# Patient Record
Sex: Male | Born: 1985 | Race: White | Hispanic: No | Marital: Single | Smoking: Never smoker
Health system: Southern US, Community
[De-identification: ages and names within clinical notes are randomized; demographics above are authoritative.]

## PROBLEM LIST (undated history)

## (undated) DIAGNOSIS — F1910 Other psychoactive substance abuse, uncomplicated: Secondary | ICD-10-CM

## (undated) DIAGNOSIS — T7840XA Allergy, unspecified, initial encounter: Secondary | ICD-10-CM

## (undated) DIAGNOSIS — F419 Anxiety disorder, unspecified: Secondary | ICD-10-CM

## (undated) HISTORY — DX: Anxiety disorder, unspecified: F41.9

## (undated) HISTORY — DX: Allergy, unspecified, initial encounter: T78.40XA

## (undated) HISTORY — DX: Other psychoactive substance abuse, uncomplicated: F19.10

---

## 2013-02-22 ENCOUNTER — Other Ambulatory Visit: Payer: Self-pay | Admitting: *Deleted

## 2013-02-22 ENCOUNTER — Ambulatory Visit
Admission: RE | Admit: 2013-02-22 | Discharge: 2013-02-22 | Disposition: A | Discharge status: Home / Self Care | Payer: No Typology Code available for payment source | Source: Ambulatory Visit | Attending: *Deleted | Admitting: *Deleted

## 2013-02-22 DIAGNOSIS — R7611 Nonspecific reaction to tuberculin skin test without active tuberculosis: Secondary | ICD-10-CM

## 2015-08-29 IMAGING — CR DG CHEST 2V
2 series · 2 of 2 positions shown · non-contrast
Comparison: None.

CLINICAL DATA: Positive PPD.

EXAM:
CHEST  2 VIEW

[w chest pa]
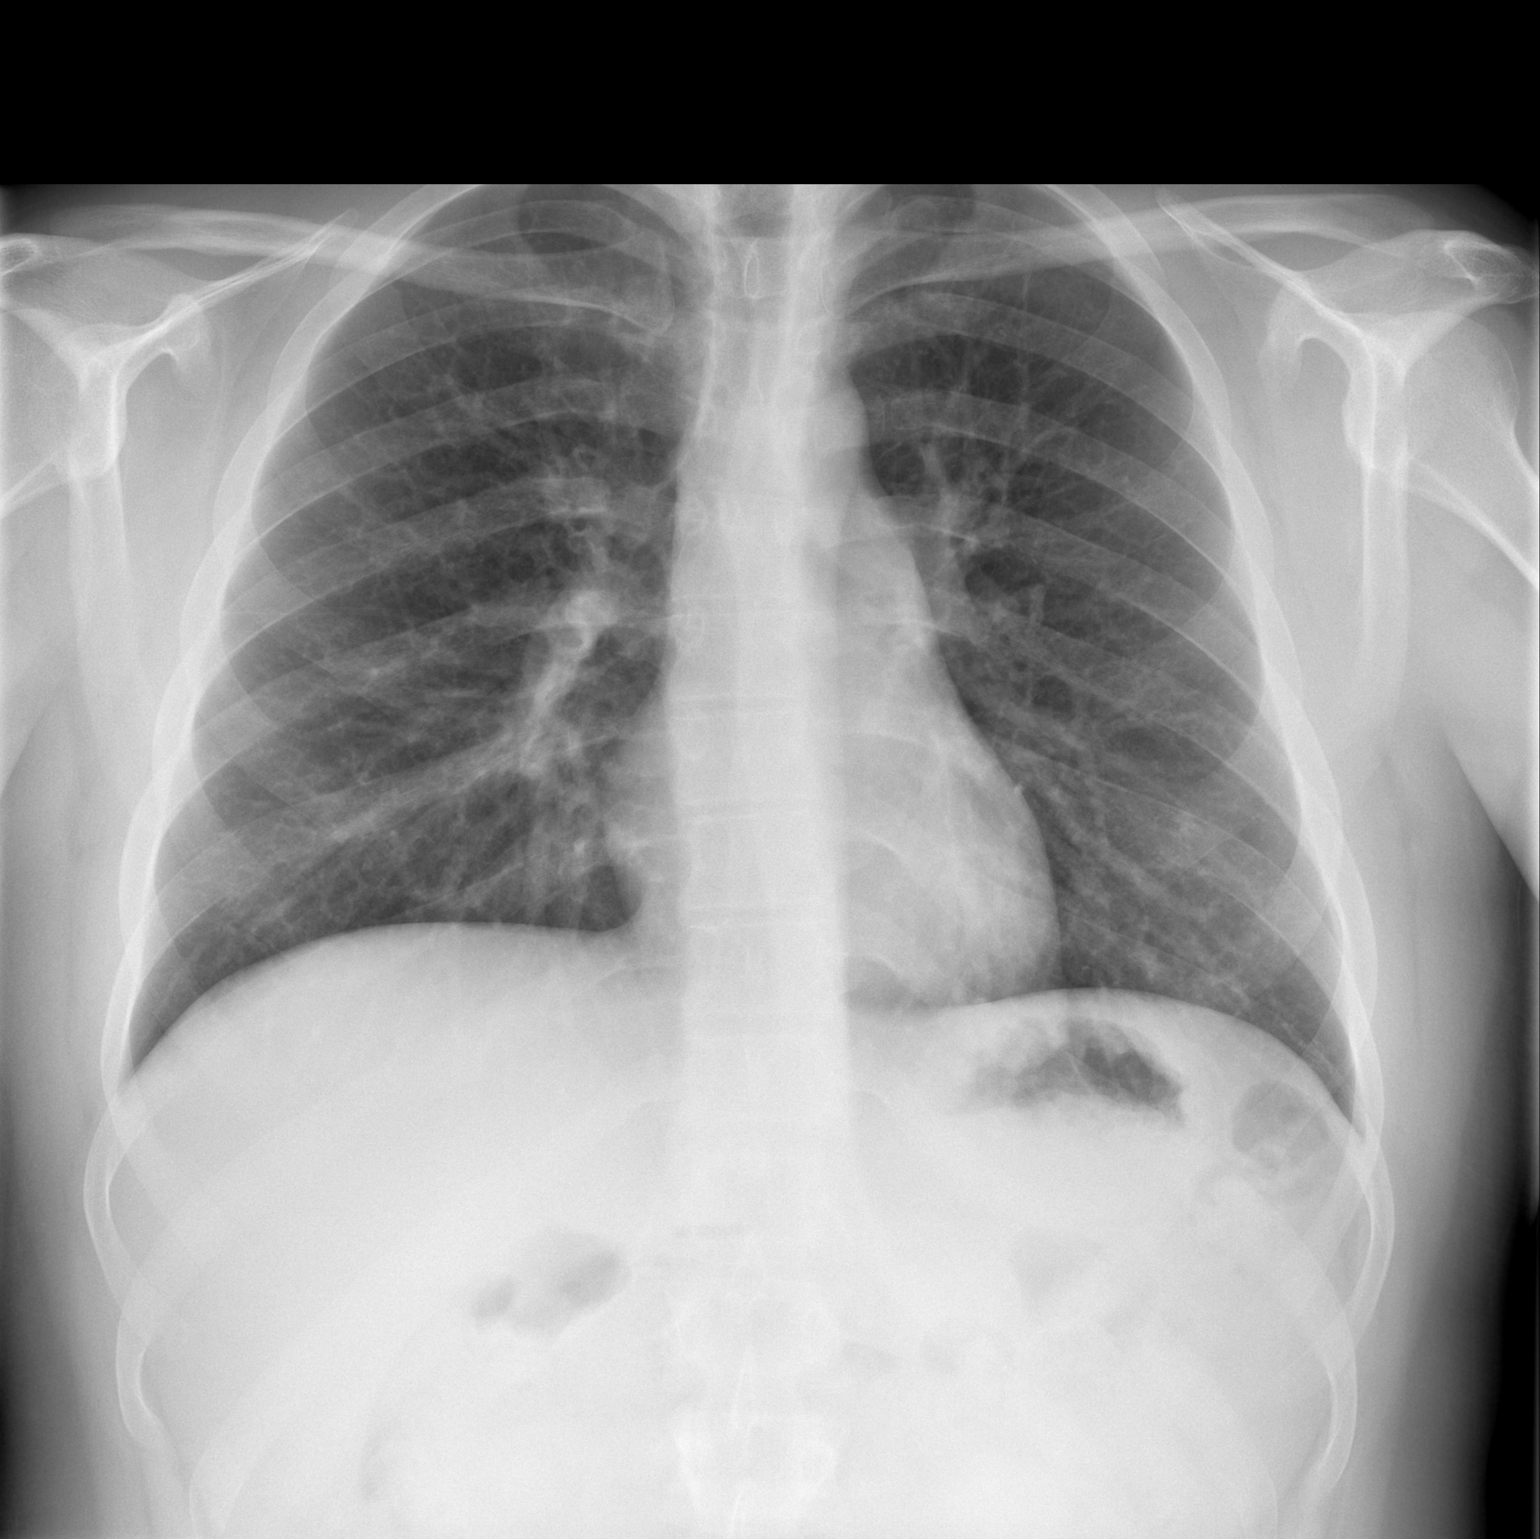

[w chest lat]
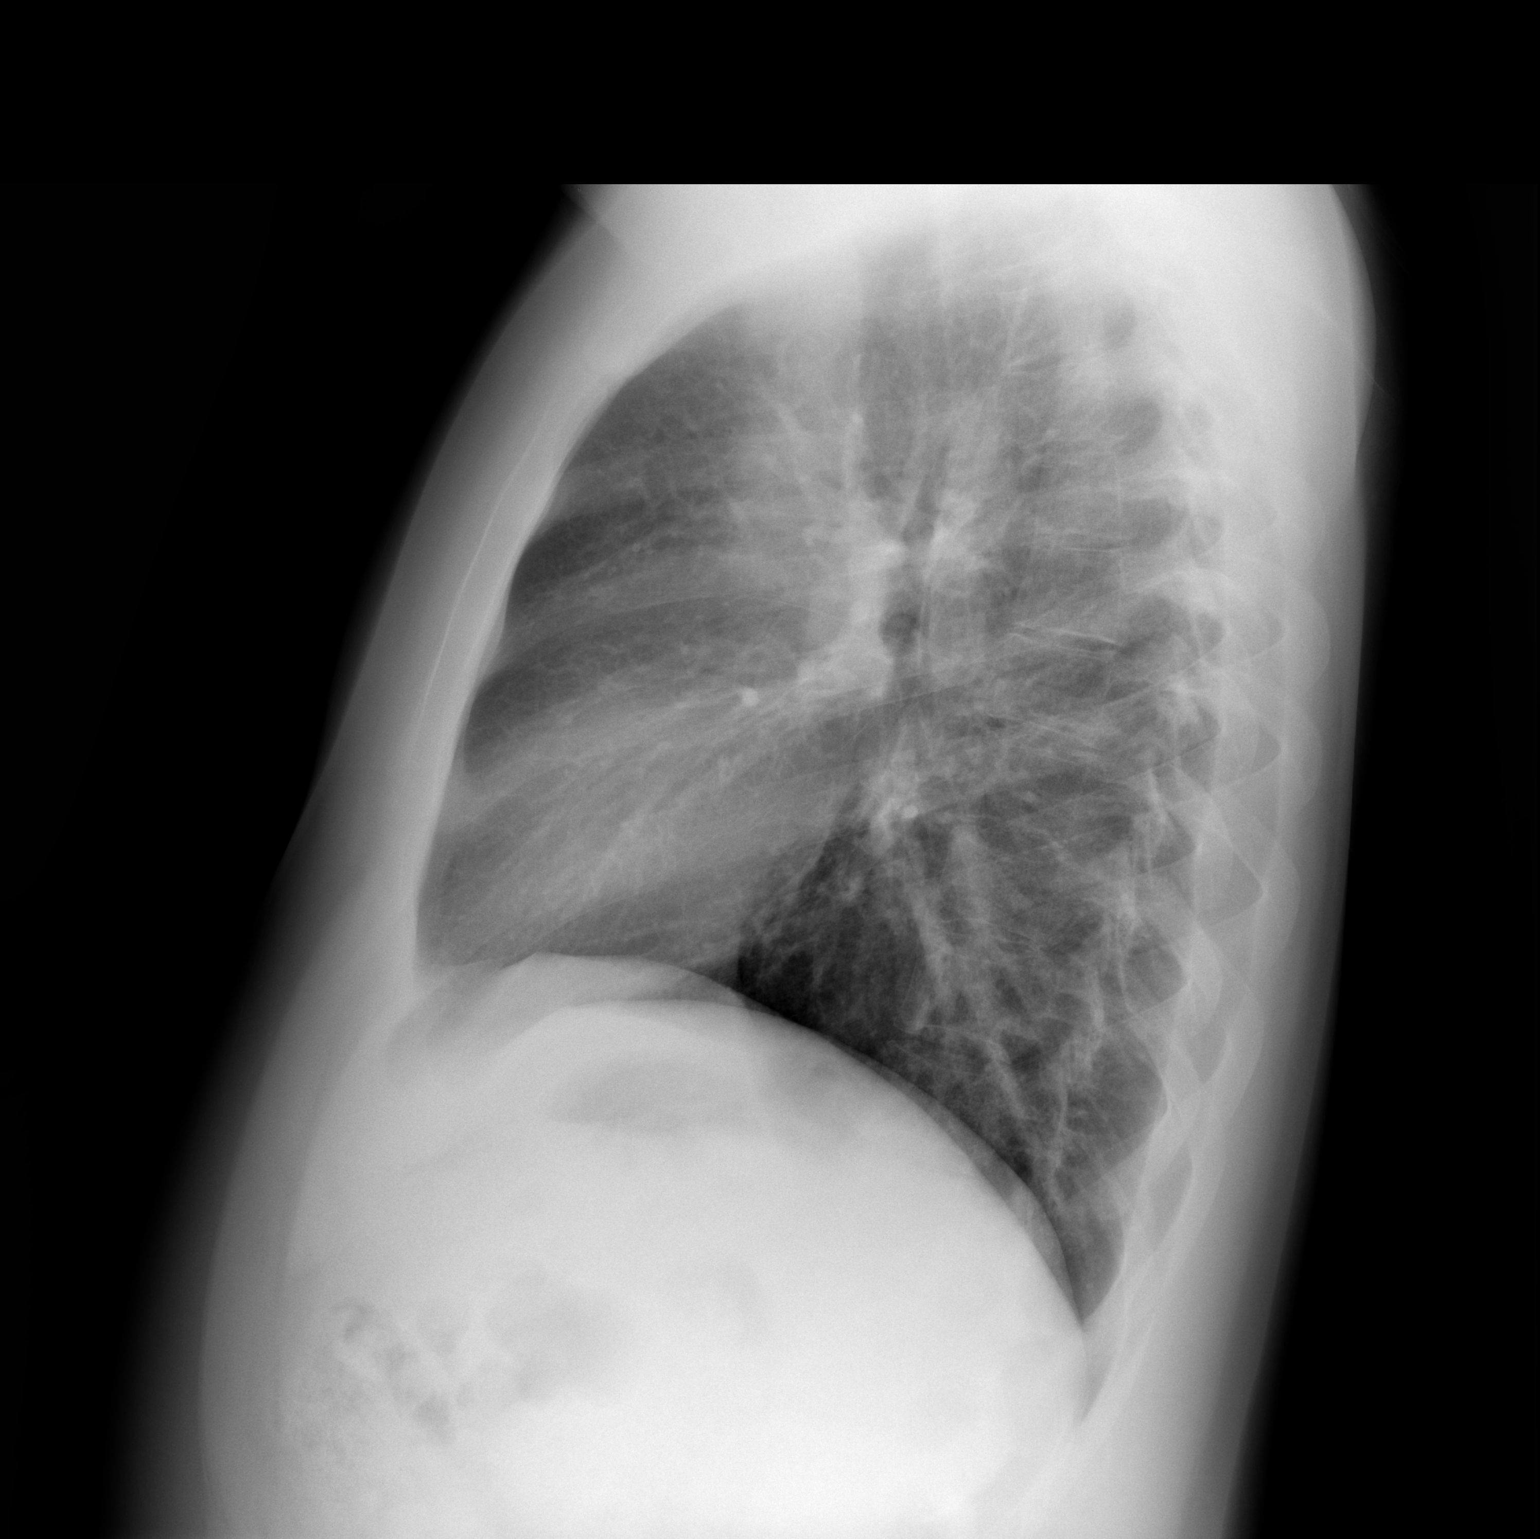

[2 of 2 positions shown; findings below may reference images not displayed]

FINDINGS: The cardiac silhouette, mediastinal and hilar contours are normal.
The lungs demonstrate bronchitic changes, likely related to smoking.
No infiltrates, edema or effusions. No definite calcified
granulomas. The bony thorax is intact.
IMPRESSION: Bronchitic type lung changes likely related to smoking. No acute
pulmonary findings.

## 2019-03-19 ENCOUNTER — Ambulatory Visit: Payer: Self-pay | Attending: Internal Medicine

## 2019-03-19 DIAGNOSIS — Z23 Encounter for immunization: Secondary | ICD-10-CM | POA: Insufficient documentation

## 2019-03-19 NOTE — Progress Notes (Signed)
   Covid-19 Vaccination Clinic  Name:  Tyler Cook    MRN: [id number] DOB: Aug 11, 1985  03/19/2019  [name] was observed post Covid-19 immunization for 15 minutes without incident. He was provided with Vaccine Information Sheet and instruction to access the V-Safe system.   [name] was instructed to call 911 with any severe reactions post vaccine: Marland Kitchen Difficulty breathing  . Swelling of face and throat  . A fast heartbeat  . A bad rash all over body  . Dizziness and weakness   Immunizations Administered    Name Date Dose VIS Date Route   Pfizer COVID-19 Vaccine 03/19/2019  4:07 PM 0.3 mL 12/22/2018 Intramuscular   Manufacturer: [employer], [employer]   Lot: [id number]   NDC: [address]-8

## 2019-04-18 ENCOUNTER — Ambulatory Visit: Payer: Self-pay | Attending: Internal Medicine

## 2019-04-18 DIAGNOSIS — Z23 Encounter for immunization: Secondary | ICD-10-CM

## 2019-04-18 NOTE — Progress Notes (Signed)
   Covid-19 Vaccination Clinic  Name:  Tyler Cook    MRN: [id number] DOB: 01/24/1985  04/18/2019  Mr. Tyler Cook was observed post Covid-19 immunization for 15 minutes without incident. He was provided with Vaccine Information Sheet and instruction to access the V-Safe system.   Mr. Tyler Cook was instructed to call 911 with any severe reactions post vaccine: Marland Kitchen Difficulty breathing  . Swelling of face and throat  . A fast heartbeat  . A bad rash all over body  . Dizziness and weakness   Immunizations Administered    Name Date Dose VIS Date Route   Pfizer COVID-19 Vaccine 04/18/2019  3:25 PM 0.3 mL 12/22/2018 Intramuscular   Manufacturer: [employer], [employer]   Lot: [id number]   NDC: [address]-6

## 2022-10-21 ENCOUNTER — Encounter: Payer: Self-pay | Admitting: Internal Medicine

## 2022-10-21 ENCOUNTER — Ambulatory Visit: Payer: 59 | Admitting: Internal Medicine

## 2022-10-21 VITALS — BP 126/68 | HR 81 | Temp 97.9°F | Ht 72.0 in | Wt 186.4 lb

## 2022-10-21 DIAGNOSIS — Z119 Encounter for screening for infectious and parasitic diseases, unspecified: Secondary | ICD-10-CM | POA: Diagnosis not present

## 2022-10-21 DIAGNOSIS — Z83719 Family history of colon polyps, unspecified: Secondary | ICD-10-CM

## 2022-10-21 DIAGNOSIS — Z0000 Encounter for general adult medical examination without abnormal findings: Secondary | ICD-10-CM

## 2022-10-21 DIAGNOSIS — L578 Other skin changes due to chronic exposure to nonionizing radiation: Secondary | ICD-10-CM

## 2022-10-21 DIAGNOSIS — Z0001 Encounter for general adult medical examination with abnormal findings: Secondary | ICD-10-CM | POA: Diagnosis not present

## 2022-10-21 DIAGNOSIS — K625 Hemorrhage of anus and rectum: Secondary | ICD-10-CM

## 2022-10-21 DIAGNOSIS — F17209 Nicotine dependence, unspecified, with unspecified nicotine-induced disorders: Secondary | ICD-10-CM

## 2022-10-21 DIAGNOSIS — E65 Localized adiposity: Secondary | ICD-10-CM | POA: Diagnosis not present

## 2022-10-21 NOTE — Patient Instructions (Signed)
VISIT SUMMARY:  During your visit, we discussed your concerns about potential prediabetes and colon cancer due to your diet and family history. We also addressed your recent stomach cramps, your smoking and vaping habits, and your family history of polyps and prostate cancer. We discussed your diet, exercise habits, and your risk for skin cancer due to sun exposure. We also reviewed your general health maintenance, including your dental and eye care, and your screenings for Hepatitis C and HIV.  YOUR PLAN:  -GASTROENTERITIS: You had stomach cramps after eating spoiled chicken. This is usually a temporary condition caused by a bacterial or viral infection. We will monitor your symptoms and make sure you stay hydrated.  -OVERWEIGHT: Your Body Mass Index (BMI) is slightly above the normal range. This can increase your risk for health problems. We discussed the importance of a healthy diet and regular exercise.  -FAMILY HISTORY OF POLYPS: You mentioned that a parent had polyps. Polyps are small growths in the colon that can sometimes become cancerous. We advised you to watch for blood in your stool, which could indicate a need for a colonoscopy.  -HEMORRHOIDS: You've noticed bright red blood in your stool, which is likely due to hemorrhoids. Hemorrhoids are swollen veins in your rectum or anus. We advised you to monitor and report any changes.  -NICOTINE DEPENDENCE: You have a history of smoking and currently use a vape. This can harm your lungs. We encouraged you to consider nicotine gums or patches as a safer alternative.  -SKIN CANCER RISK: You spend time outdoors and do not regularly use sunblock. This can increase your risk for skin cancer. We advised you to regularly apply sunblock and watch for any changes in your skin.  -DIET: Your diet is high in red meat and low in fruits and vegetables. This can increase your risk for health problems. We advised you to eat more lean meats, fruits, and  vegetables.  INSTRUCTIONS:  We will order labs for diabetes screening and we deferred colonoscopy or Cologuard test for colon cancer screening. We encouraged you to get a tetanus shot and maintain regular dental and eye exams. We also encouraged you to self-check for lumps or bumps in the neck, underarms, and thyroid, and to self-check your testicles for lumps or bumps. We will consider a PSA blood test for prostate cancer screening.

## 2022-10-21 NOTE — Progress Notes (Signed)
[facility]: [phone]   -- Annual Preventive Medical Office Visit --  Patient:  Tyler Cook      Age: 37 y.o.       Sex:  male  Date:   10/21/2022 Patient Care Team: Lula Olszewski, MD as PCP - General (Internal Medicine) Today's Healthcare Provider: Lula Olszewski, MD   Chief Complaint  Patient presents with   New Patient (Initial Visit)   Annual Exam  Purpose of Visit: Comprehensive preventive health assessment and personalized health maintenance planning.  This encounter was conducted as a Comprehensive Physical Exam (CPE) preventive care annual visit. The patient's medical history and problem list were reviewed to inform individualized preventive care recommendations. No problem-specific medical treatment was provided during this visit.  Assessment & Plan Encounter for annual general medical examination with abnormal findings in adult  Central adiposity His BMI is slightly elevated. We encouraged him to maintain a healthy diet and regular exercise. Preventative health care He regularly consumes red meat with occasional vegetables and fruits. We advised him to switch to lean meats and increase his intake of fruits and vegetables. Encounter for annual health examination We will order labs for diabetes screening and a Cologuard test for colon cancer screening. We encouraged him to get a tetanus shot and maintain regular dental and eye exams, BUT we forgot to complete prior to departure. We also encouraged him to self-check for lumps or bumps in the neck, underarms, and thyroid, and to self-check his testicles for lumps or bumps. We will consider a PSA blood test for prostate cancer screening.     Screening examination for infectious disease  FH: colon polyps He reported a parent had polyps. We advised him to monitor for blood in stool, which could warrant a colonoscopy. BRBPR (bright red blood per rectum) He reports bright red blood in stool, likely  due to hemorrhoids. We advised him to monitor and report any changes. Nicotine dependence with nicotine-induced disorder, unspecified nicotine product type Encouraged patient to switch from vapes to nicotine replacement therapy with gums/lozenges/patches. Sun-damaged skin He spends time outdoors and does not regularly use sunblock. We advised him to regularly apply sunblock and monitor for signs of skin cancer.   Diagnoses and all orders for this visit: Encounter for annual general medical examination with abnormal findings in adult -     HgB A1c -     Hepatitis C Antibody; Future Central adiposity -     HgB A1c Preventative health care -     CBC w/Diff -     Comp Met (CMET) -     Lipid panel -     TSH -     Hepatitis C antibody, reflex -     HIV antibody (with reflex) -     HgB A1c -     Hepatitis C Antibody; Future Encounter for annual health examination -     CBC w/Diff -     Comp Met (CMET) -     Lipid panel -     TSH -     Hepatitis C antibody, reflex -     HIV antibody (with reflex) -     HgB A1c Screening examination for infectious disease -     Hepatitis C antibody, reflex -     HIV antibody (with reflex) -     Hepatitis C Antibody; Future FH: colon polyps BRBPR (bright red blood per rectum) Nicotine dependence with nicotine-induced disorder, unspecified nicotine product type  Today's Health Maintenance Counseling and Anticipatory Guidance:  Eye exams:  We encouraged patient to to complete eye exam every 1-2 years.   Dental health:  He reports he has been keeping up with his dental visits.  He intends to do so in the future.  We encourage regular tooth brushing/flossing. Sinus health:  We encouraged sterile saline nasal misting sinus rinses daily for pollen, to reduce allergies and risk for sinus infections.   Sterile can based misting products are recommended due to superior misting and ease of maintaining sterility Sleep Apnea screening:  We encourage self  monitoring of sleep quality with [employer] App and other tools/apps that are now available at retail Cardiovascular Risk Factor Reduction:   Advised patient of need for regular exercise and diet rich and fruits and vegetables and healthy fats to reduce risk of heart attack and stroke.  Avoid first- and second-hand smoke and stimulants.   Avoid extreme exercise- exercise in moderation (150 minutes per week is a good goal) Wt Readings from Last 3 Encounters:  10/21/22 186 lb 6.4 oz (84.6 kg)  Body mass index is 25.28 kg/m. Health maintenance and immunizations reviewed and he was encouraged to complete anything that is due: Immunization History  Administered Date(s) Administered   Influenza-Unspecified 10/08/2022   PFIZER(Purple Top)SARS-COV-2 Vaccination 03/19/2019, 04/18/2019   Health Maintenance Due  Topic Date Due   HIV Screening  Never done   Hepatitis C Screening  Never done   DTaP/Tdap/Td (1 - Tdap) Never done    Sexual transmitted infection screening: testing offered today, but patient declined as he feels he is low risk based on his sexual history.   Social History   Substance and Sexual Activity  Sexual Activity Not Currently     Substance use:  I discussed that my recommendation is total abstinence from all substances of abuse including smoke and 2nd hand smoke, alcohol, illicit drugs, smoking, inhalants, sugar.   Offered to assist with any use disorders or addictions.   Injury prevention: Discussed safety Cancer Screening: Penile/Testicle/Scrotum cancer screening: Asked the patient about genital warts or tumors/abnormalities of penis/testicles/scrotum, encouraged patient to to inform me of any. Patient reports there are none. Thyroid cancer screening: patient advised to check by palpating thyroid for nodules Prostate cancer screening:  Denies family history of prostate cancer or hematospermia so too young for screening by current guidelines.(No results found for: "PSA") Colon  cancer screening:    Denies strong family history of colon cancer but does have family history, and has intermittent bright red blood per rectum so screening offered but he defers/declines recommendations today, and has full decision making capacity    Lung cancer screening:  Current guidelines recommend Individuals aged 61 to 66 who currently smoke or formerly smoked and have a >= 20 pack-year smoking history should undergo annual screening with low-dose computed tomography (LDCT).  Patient reports only about 10 pack year but vaped after that. Skin cancer screening: Advised regular sunscreen use. He denies worrisome, changing, or new skin lesions. Showed him pictures of melanomas for reference:   Return to care in 1 year for next preventative visit.   Subjective  AI-Extracted: Discussed the use of AI scribe software for clinical note transcription with the patient, who gave verbal consent to proceed.  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  The patient reports experiencing severe stomach cramps after consuming spoiled chicken, but this appears to be an isolated incident.  The patient has a history of smoking, having consumed approximately a pack a day for 12 years, and has been vaping for the past 4-5 years. The patient also reports occasional bright red blood in the stool, which he suspects might be due to hemorrhoids.  The patient has a family history of prostate cancer, but the onset in the family member was later in life. The patient also has a family history of polyps, but it is unclear whether these were precancerous.  The patient's diet is reported to be high in red meat and low in vegetables, and he expresses concern about potential prediabetes. The patient also reports a lack of regular exercise  and a body mass index slightly above the normal range.  The patient has been sober for ten years and was screened for Hepatitis C and HIV at the time of entering rehab. The patient has not used needles since rehab.  The patient has a dental plan and has been diligent about dental check-ups for the past two years. The patient also has an eye insurance plan but has not utilized it for an eye exam yet.  The patient lives at home with his parents and is currently single. The patient has a history of sun exposure but does not regularly use sunscreen. The patient does not consume alcohol.      ROS   Disclaimer about ROS at Annual Preventive Visits Patients are informed before the Review of Systems (ROS) that identifying significant medical issues during the wellness visit may require immediate attention, potentially resulting in a separate billable encounter beyond the scope of the preventive exam. This disclosure is mandated by professional ethics and legal obligations, as healthcare providers must address any substantial health concerns raised during any patient interaction. A comprehensive ROS is required by insurance companies for billing the visit. However, this structure may inadvertently discourage patients from fully disclosing health concerns due to potential financial implications. Consequently, patients often emphasize that any positive ROS findings are related to stable chronic conditions, requesting that these not be discussed during the preventive visit to avoid additional charges. Patients may also ask that reported complaints not be listed in the ROS to prevent affecting billing.  Problem list overviews that were updated at today's visit: No problems updated. I attest that I have reviewed and confirmed the patients current medications to meet the medication reconciliation requirement  Current Outpatient Medications on File Prior to Visit  Medication Sig   B Complex Vitamins (B COMPLEX  PO) Take 1 tablet by mouth daily.   OMEPRAZOLE PO Take 1 tablet by mouth daily.   No current facility-administered medications on file prior to visit.  There are no discontinued medications. The following were reviewed and entered/updated into our electronic MEDICAL RECORD NUMBER   10/21/2022    3:14 PM  Depression screen PHQ 2/9  Decreased Interest 1  Down, Depressed, Hopeless 0  PHQ - 2 Score 1  Altered sleeping 1  Tired, decreased energy 1  Change in appetite 0  Feeling bad or failure about yourself  0  Trouble concentrating 0  Moving slowly or fidgety/restless 0  Suicidal thoughts 0  PHQ-9 Score 3  Difficult doing work/chores Not difficult at all   Past Medical History:  Diagnosis Date   Allergies    Anxiety    Substance abuse (HCC)    There are no problems to  display for this patient.  History reviewed. No pertinent surgical history. Family History  Problem Relation Age of Onset   Depression Sister    Hypertension Maternal Grandfather    Hyperlipidemia Maternal Grandfather    Heart disease Maternal Grandfather    Diabetes Maternal Grandfather    Heart attack Maternal Grandfather    Hearing loss Paternal Grandmother    Hyperlipidemia Paternal Grandfather    Heart disease Paternal Grandfather    Heart attack Paternal Grandfather    Colon cancer Other    No Known Allergies Social History   Tobacco Use   Smoking status: Never   Smokeless tobacco: Never  Vaping Use   Vaping status: Every Day  Substance Use Topics   Alcohol use: Not Currently   Drug use: Not Currently    Types: Benzodiazepines, Heroin, Marijuana, Other-see comments    Comment: pain pills     Objective  BP 126/68 (BP Location: Right Arm, Patient Position: Sitting)   Pulse 81   Temp 97.9 F (36.6 C) (Temporal)   Ht 6' (1.829 m)   Wt 186 lb 6.4 oz (84.6 kg)   SpO2 99%   BMI 25.28 kg/m   Body mass index is 25.28 kg/m. Wt Readings from Last 3 Encounters:  10/21/22 186 lb 6.4 oz (84.6 kg)   Physical Exam       GEN: NAD, Resting Comfortably. Truncal adiposity very mild HEENT: Tympanic membranes normal appearing bilaterally, Oropharynx clear, No Thyromegaly noted. No palpable lymphadenopathy or thyroid nodules. CARDIOVASCULAR: S1 and S2 heart sounds have regular rate and rhythm with no murmurs appreciated. PULMONARY:  Normal work of breathing. Clear to auscultation bilaterally with no crackles, or rhonchi., but there is faint wheezing ABDOMEN: Soft, Nontender, Nondistended.  MSK: No edema, cyanosis, or clubbing noted. SKIN: Warm, dry, no lesions of concern observed. NEURO: CN2-12 grossly intact. Strength 5/5 in upper and lower extremities. Reflexes symmetric and intact bilaterally.  PSYCH: Normal affect and thought content, pleasant and cooperative.

## 2022-10-22 LAB — COMPREHENSIVE METABOLIC PANEL
ALT: 12 U/L (ref 0–53)
AST: 21 U/L (ref 0–37)
Albumin: 4.8 g/dL (ref 3.5–5.2)
Alkaline Phosphatase: 43 U/L (ref 39–117)
BUN: 14 mg/dL (ref 6–23)
CO2: 26 meq/L (ref 19–32)
Calcium: 9.9 mg/dL (ref 8.4–10.5)
Chloride: 103 meq/L (ref 96–112)
Creatinine, Ser: 0.82 mg/dL (ref 0.40–1.50)
GFR: 114.17 mL/min (ref >60.00)
Glucose, Bld: 84 mg/dL (ref 70–99)
Potassium: 4.3 meq/L (ref 3.5–5.1)
Sodium: 139 meq/L (ref 135–145)
Total Bilirubin: 0.4 mg/dL (ref 0.2–1.2)
Total Protein: 7.1 g/dL (ref 6.0–8.3)

## 2022-10-22 LAB — LIPID PANEL
Cholesterol: 193 mg/dL (ref 0–200)
HDL: 61.1 mg/dL (ref >39.00)
LDL Cholesterol: 115 mg/dL — ABNORMAL HIGH (ref 0–99)
NonHDL: 131.72
Total CHOL/HDL Ratio: 3
Triglycerides: 82 mg/dL (ref 0.0–149.0)
VLDL: 16.4 mg/dL (ref 0.0–40.0)

## 2022-10-22 LAB — CBC WITH DIFFERENTIAL/PLATELET
Basophils Absolute: 0.1 10*3/uL (ref 0.0–0.1)
Basophils Relative: 1.5 % (ref 0.0–3.0)
Eosinophils Absolute: 0.2 10*3/uL (ref 0.0–0.7)
Eosinophils Relative: 1.8 % (ref 0.0–5.0)
HCT: 42.9 % (ref 39.0–52.0)
Hemoglobin: 14.3 g/dL (ref 13.0–17.0)
Lymphocytes Relative: 30.1 % (ref 12.0–46.0)
Lymphs Abs: 2.6 10*3/uL (ref 0.7–4.0)
MCHC: 33.5 g/dL (ref 30.0–36.0)
MCV: 90.5 fL (ref 78.0–100.0)
Monocytes Absolute: 0.7 10*3/uL (ref 0.1–1.0)
Monocytes Relative: 8.3 % (ref 3.0–12.0)
Neutro Abs: 5 10*3/uL (ref 1.4–7.7)
Neutrophils Relative %: 58.3 % (ref 43.0–77.0)
Platelets: 324 10*3/uL (ref 150.0–400.0)
RBC: 4.74 Mil/uL (ref 4.22–5.81)
RDW: 13.5 % (ref 11.5–15.5)
WBC: 8.6 10*3/uL (ref 4.0–10.5)

## 2022-10-22 LAB — TSH: TSH: 3 u[IU]/mL (ref 0.35–5.50)

## 2022-10-22 LAB — HEMOGLOBIN A1C: Hgb A1c MFr Bld: 5.4 % (ref 4.6–6.5)

## 2022-10-23 LAB — HIV ANTIBODY (ROUTINE TESTING W REFLEX): HIV 1&2 Ab, 4th Generation: NONREACTIVE

## 2022-10-23 LAB — HEPATITIS C ANTIBODY: Hepatitis C Ab: NONREACTIVE

## 2023-10-24 ENCOUNTER — Encounter: Payer: 59 | Admitting: Internal Medicine

## 2023-12-21 ENCOUNTER — Encounter: Admitting: Internal Medicine

## 2024-01-27 ENCOUNTER — Encounter: Payer: Self-pay | Admitting: Internal Medicine

## 2024-01-27 ENCOUNTER — Ambulatory Visit: Admitting: Internal Medicine

## 2024-01-27 VITALS — BP 110/70 | HR 70 | Temp 98.0°F | Ht 72.0 in | Wt 200.2 lb

## 2024-01-27 DIAGNOSIS — Z23 Encounter for immunization: Secondary | ICD-10-CM

## 2024-01-27 DIAGNOSIS — Z83719 Family history of colon polyps, unspecified: Secondary | ICD-10-CM

## 2024-01-27 DIAGNOSIS — D485 Neoplasm of uncertain behavior of skin: Secondary | ICD-10-CM | POA: Diagnosis not present

## 2024-01-27 DIAGNOSIS — Z0001 Encounter for general adult medical examination with abnormal findings: Secondary | ICD-10-CM | POA: Diagnosis not present

## 2024-01-27 DIAGNOSIS — F17209 Nicotine dependence, unspecified, with unspecified nicotine-induced disorders: Secondary | ICD-10-CM | POA: Diagnosis not present

## 2024-01-27 DIAGNOSIS — Z13220 Encounter for screening for lipoid disorders: Secondary | ICD-10-CM | POA: Diagnosis not present

## 2024-01-27 DIAGNOSIS — L578 Other skin changes due to chronic exposure to nonionizing radiation: Secondary | ICD-10-CM

## 2024-01-27 DIAGNOSIS — E65 Localized adiposity: Secondary | ICD-10-CM

## 2024-01-27 DIAGNOSIS — S4992XS Unspecified injury of left shoulder and upper arm, sequela: Secondary | ICD-10-CM | POA: Diagnosis not present

## 2024-01-27 DIAGNOSIS — K625 Hemorrhage of anus and rectum: Secondary | ICD-10-CM | POA: Diagnosis not present

## 2024-01-27 LAB — LIPID PANEL
Cholesterol: 151 mg/dL (ref 28–200)
HDL: 56.5 mg/dL
LDL Cholesterol: 85 mg/dL (ref 10–99)
NonHDL: 94.68
Total CHOL/HDL Ratio: 3
Triglycerides: 48 mg/dL (ref 10.0–149.0)
VLDL: 9.6 mg/dL (ref 0.0–40.0)

## 2024-01-27 LAB — COMPREHENSIVE METABOLIC PANEL WITH GFR
ALT: 23 U/L (ref 3–53)
AST: 20 U/L (ref 5–37)
Albumin: 4.7 g/dL (ref 3.5–5.2)
Alkaline Phosphatase: 48 U/L (ref 39–117)
BUN: 16 mg/dL (ref 6–23)
CO2: 30 meq/L (ref 19–32)
Calcium: 9.7 mg/dL (ref 8.4–10.5)
Chloride: 100 meq/L (ref 96–112)
Creatinine, Ser: 0.72 mg/dL (ref 0.40–1.50)
GFR: 117.69 mL/min
Glucose, Bld: 97 mg/dL (ref 70–99)
Potassium: 4.8 meq/L (ref 3.5–5.1)
Sodium: 137 meq/L (ref 135–145)
Total Bilirubin: 0.4 mg/dL (ref 0.2–1.2)
Total Protein: 7.1 g/dL (ref 6.0–8.3)

## 2024-01-27 LAB — CBC WITH DIFFERENTIAL/PLATELET
Basophils Absolute: 0 K/uL (ref 0.0–0.1)
Basophils Relative: 0.4 % (ref 0.0–3.0)
Eosinophils Absolute: 0 K/uL (ref 0.0–0.7)
Eosinophils Relative: 0.7 % (ref 0.0–5.0)
HCT: 41.9 % (ref 39.0–52.0)
Hemoglobin: 14.2 g/dL (ref 13.0–17.0)
Lymphocytes Relative: 17.2 % (ref 12.0–46.0)
Lymphs Abs: 1.1 K/uL (ref 0.7–4.0)
MCHC: 33.9 g/dL (ref 30.0–36.0)
MCV: 88.9 fl (ref 78.0–100.0)
Monocytes Absolute: 0.4 K/uL (ref 0.1–1.0)
Monocytes Relative: 6.5 % (ref 3.0–12.0)
Neutro Abs: 4.9 K/uL (ref 1.4–7.7)
Neutrophils Relative %: 75.2 % (ref 43.0–77.0)
Platelets: 311 K/uL (ref 150.0–400.0)
RBC: 4.71 Mil/uL (ref 4.22–5.81)
RDW: 13.4 % (ref 11.5–15.5)
WBC: 6.5 K/uL (ref 4.0–10.5)

## 2024-01-27 NOTE — Patient Instructions (Signed)
 VISIT SUMMARY: Today, you came in for your annual physical exam. We discussed several health concerns, including a shoulder injury, occasional blood in your stool, nicotine dependence, sun-damaged skin, and your diet and exercise habits.  YOUR PLAN: -LEFT SHOULDER INJURY: You have a large, non-tender lump and tightness in your left shoulder, likely from a fall in November. There is no pain, but there may be a misalignment of the shoulder blade. Surgery is not needed at this time, but you will see a sports medicine specialist for further evaluation and management.  -BRIGHT RED BLOOD PER RECTUM (BRBPR): You experience occasional bright red blood in your stool, likely due to hemorrhoids. There is a family history of colon polyps, but you have decided to defer a colonoscopy for another year. If symptoms persist or worsen, a consultation with a gastrointestinal specialist is recommended.  -NICOTINE DEPENDENCE: You currently use vaping products and previously smoked cigarettes. We discussed the potential risks of vaping, including exposure to heavy metals and the lack of long-term data. Although you enjoy vaping and are not interested in quitting at this time, you are encouraged to consider nicotine lozenges or Xen as alternatives.  -SUN-DAMAGED SKIN AND NEOPLASM OF UNCERTAIN BEHAVIOR OF SKIN: You have sun-damaged skin and multiple skin spots. You will see a dermatologist for an assessment of these conditions.  -CENTRAL ADIPOSITY: Your diet is high in carbohydrates, and you do not have an organized exercise routine. We discussed the importance of reducing carbohydrate intake, and you are encouraged to make dietary changes.  INSTRUCTIONS: Please follow up with the sports medicine specialist for your shoulder injury and the dermatologist for your skin assessment. If the blood in your stool persists or worsens, consider consulting a gastrointestinal specialist. Make dietary changes to reduce carbohydrate  intake and consider alternative nicotine products if you decide to reduce or quit vaping.  Building Your Long-Term Health Plan  During today's preventive visit, we covered a variety of important health checks to help you stay on top of your well-being.  We also discussed strategies to maintain your health and identified some areas that might benefit from further exploration.   Preventive care visits like today's are designed to be proactive, but sometimes additional attention may be needed.  Rest assured, we're here for you.  If these areas require further evaluation or management, we'd be happy to schedule a separate, focused appointment to address them in detail.  Addressing Next Steps  [x]   Follow-up Visit: To ensure we address any unresolved issues and continue monitoring your overall health, we recommend scheduling a follow-up appointment in 1 year for your next preventive care visit. If you experience any new problems, need to discuss any medical concerns, or your condition worsens before then, please don't hesitate to call our office to schedule an appointment or seek emergency care as needed.  [x]   Preventive Measures: Maintaining healthy habits plays a crucial role in overall wellness. We recommend considering these tips: [x]   Regular appointments with dental and vision professionals [x]   Nightly nasal saline mist to keep sinuses clear [x]   Consistent toothbrushing to maintain oral health [x]   Using an app like SnoreLab to track sleep quality [x]   Routine checks of blood pressure and heart rate [x]   Medical Information: In some instances, we may require additional medical information from other providers to create a comprehensive picture of your health. If applicable, we can provide a medical information release form at the front desk for you to sign, allowing us  to gather  these records. [x]   Lab Tests: If any lab tests were ordered today, scheduling them within a week of your visit helps  ensure the best possible insurance coverage.  Planning Follow Up to Work on a Problem? Make the Most of Our Focused (20 minute) Appointments  [x]   Clearly state your top concerns at the beginning of the visit to focus our discussion [x]   If you anticipate you will need more time, please inform the front desk during scheduling - we can book multiple appointments in the same week. [x]   If you have transportation problems- use our convenient video appointments or ask about transportation support. [x]   We can get down to business faster if you use MyChart to update information before the visit and submit non-urgent questions before your visit. Thank you for taking the time to provide details through MyChart.  Let our nurse know and she can import this information into your encounter documents.  Arrival and Wait Times  [x]   Arriving on time ensures that everyone receives prompt attention. [x]   Early morning (8a) and afternoon (1p) appointments tend to have shortest wait times. [x]   Unfortunately, we cannot delay appointments for late arrivals or hold slots during phone calls.  Bring to Your Next Appointment:  [x]   Medications: Please bring all your medication bottles to your next appointment to ensure we have an accurate record of your prescriptions. [x]   Health Diaries: If you're monitoring any health conditions at home, keeping a diary of your readings can be very helpful for discussions at your next appointment.  Reviewing Your Records  [x]   Review your attached preventive care information at the end of these patient instructions. [x]   Review this early draft of your clinical encounter notes below and the final encounter summary tomorrow on MyChart after its been completed.      Getting Answers and Following Up  [x]   Simple Questions & Concerns: For quick questions or basic follow-up after your visit, reach us  at [phone] or MyChart messaging. [x]   Complex Concerns: If your concern  is more complex, scheduling an appointment might be best. Discuss this with the staff to find the most suitable option. [x]   Lab & Imaging Results: We'll contact you directly if results are abnormal or you don't use MyChart. Most normal results will be on MyChart within 2-3 business days, with a review message from Dr. Jesus. Haven't heard back in 2 weeks? Need results sooner? Contact us  at [phone]. [x]   Referrals: Our referral coordinator will manage specialist referrals. The specialist's office should contact you within 2 weeks to schedule an appointment. Call us  if you haven't heard from them after 2 weeks.  Staying Connected  [x]   MyChart: Activate your MyChart for the fastest way to access results and message us . See the last page of this paperwork for instructions on how to activate.  Billing  [x]   X-ray & Lab Orders: These are billed by separate companies. Contact the invoicing company directly for questions or concerns. [x]   Visit Charges: Discuss any billing inquiries with our administrative services team.  Your Satisfaction Matters  [x]   Share Your Experience: We strive for your satisfaction! If you have any complaints, or preferably compliments, please let Dr. Jesus know directly or contact our Practice Administrators, Manuelita Rubin or [employer], by asking at the front desk.                 Next Steps  [x]   Schedule Follow-Up:  We recommend a  follow-up appointment in 1 year for your next wellness visit.  If you develop any new problems, want to address any medical issues, or your condition worsens before then, please call us  for an appointment or seek emergency care. [x]   Preventive Care:  Make sure to keep regular appointments with dental and vision professionals, use nightly nasal saline mist sprays to keep your sinuses clear and toothbrushing to protect your teeth. Use SnoreLab App or other app to track your sleep quality. Check blood pressure and  heart rate routinely. [x]   Medical Information Release:  For any relevant medical information we don't have, please sign a release form at the front desk so we can obtain it for your records. [x]   Lab Tests:  Schedule any lab tests from today for within a week to ensure best insurance coverage.    Making the Most of Our Focused (20 minute) Appointments:  [x]   Clearly state your top concerns at the beginning of the visit to focus our discussion [x]   If you anticipate you will need more time, please inform the front desk during scheduling - we can book multiple appointments in the same week. [x]   If you have transportation problems- use our convenient video appointments or ask about transportation support. [x]   We can get down to business faster if you use MyChart to update information before the visit and submit non-urgent questions before your visit. Thank you for taking the time to provide details through MyChart.  Let our nurse know and she can import this information into your encounter documents.  Arrival and Wait Times: [x]   Arriving on time ensures that everyone receives prompt attention. [x]   Early morning (8a) and afternoon (1p) appointments tend to have shortest wait times. [x]   Unfortunately, we cannot delay appointments for late arrivals or hold slots during phone calls.  Bring to Your Next Appointment  [x]   Medications: Please bring all your medication bottles to your next appointment to ensure we have an accurate record of your prescriptions. [x]   Health Diaries: If you're monitoring any health conditions at home, keeping a diary of your readings can be very helpful for discussions at your next appointment.  Reviewing Your Records  [x]   Review your attached preventive care information at the end of these patient instructions. [x]   Review this early draft of your clinical encounter notes below and the final encounter summary tomorrow on MyChart after its been completed.    Encounter for annual general medical examination with abnormal findings in adult -     Lipid panel -     Comprehensive metabolic panel with GFR -     CBC with Differential/Platelet  FH: colon polyps  BRBPR (bright red blood per rectum)  Sun-damaged skin  Nicotine dependence with nicotine-induced disorder, unspecified nicotine product type  Central adiposity  Shoulder injury, left, sequela -     Ambulatory referral to Sports Medicine  Neoplasm of uncertain behavior of skin -     Ambulatory referral to Dermatology  Immunization due -     Flu vaccine trivalent PF, 6mos and older(Flulaval,Afluria,Fluarix,Fluzone)  Other orders -     Heplisav-B  (HepB-CPG) Vaccine -     Tdap vaccine greater than or equal to 7yo IM     Getting Answers and Following Up  [x]   Simple Questions & Concerns: For quick questions or basic follow-up after your visit, reach us  at [phone] or MyChart messaging. [x]   Complex Concerns: If your concern is more complex, scheduling an appointment  might be best. Discuss this with the staff to find the most suitable option. [x]   Lab & Imaging Results: We'll contact you directly if results are abnormal or you don't use MyChart. Most normal results will be on MyChart within 2-3 business days, with a review message from Dr. Jesus. Haven't heard back in 2 weeks? Need results sooner? Contact us  at [phone]. [x]   Referrals: Our referral coordinator will manage specialist referrals. The specialist's office should contact you within 2 weeks to schedule an appointment. Call us  if you haven't heard from them after 2 weeks.  Staying Connected  [x]   MyChart: Activate your MyChart for the fastest way to access results and message us . See the last page of this paperwork for instructions on how to activate.  Billing  [x]   X-ray & Lab Orders: These are billed by separate companies. Contact the invoicing company directly for questions or concerns. [x]   Visit  Charges: Discuss any billing inquiries with our administrative services team.  Your Satisfaction Matters  [x]   Share Your Experience: We strive for your satisfaction! If you have any complaints, or preferably compliments, please let Dr. Jesus know directly or contact our Practice Administrators, Manuelita Rubin or [employer], by asking at the front desk.    Medical Screening Exam A medical screening exam (MSE) helps to determine whether you need immediate medical treatment relating to any number of symptoms you are having. This type of exam may be done in an emergency department, an urgent care setting, or your health care provider's office. Depending on your symptoms and severity, you may need additional tests or medical therapy. It is important to note that an MSE does not necessarily mean that you will need or receive further medical testing or interventions if your symptoms are not deemed to be medically urgent (emergent). Tell a health care provider about: Any allergies you have. All medicines you are taking, including vitamins, herbs, eye drops, creams, and over-the-counter medicines. Any problems you or family members have had with anesthetic medicines. Any bleeding problems you have. Any surgeries you have had. Any medical conditions you have. Whether you are pregnant or may be pregnant. What happens during the test? During the exam, a health care provider does a short, often focused, physical exam and asks about your medical history to assess: Your current symptoms. Your overall health. Your need for possible further medical intervention. What can I expect after the test? If you have a regular health care provider, make an appointment for a follow-up visit with him or her. If you do not have a regular health care provider, ask about resources in your community. Your medical screening exam may determine that: You do not need emergency treatment at this time. You need  treatment right away. You need to be transferred to another medical center. This may happen if you need an emergent specialist or consultant that is not available at the medical center you are at. You need to have more tests. A medical specialist may be consulted if needed. Get help right away if: Your condition gets worse. You develop new or troubling symptoms before you see your health care provider. These symptoms may represent a serious problem that is an emergency. Do not wait to see if the symptoms will go away. Get medical help right away. Call your local emergency services (911 in the [place] Do not drive yourself to the hospital. Summary A medical screening exam helps to determine whether you need medical treatment right away.  This type of exam may be done in an emergency department, an urgent care setting, or your health care provider's office. During the exam, a health care provider does a short physical exam and asks about your current symptoms and overall health. Depending on the exam, more tests or therapies may be ordered. However, an MSE does not necessarily mean that you will have further medical testing if your symptoms are not deemed to be urgent. If you need further care that is not offered at your current medical center, you may need to be transferred to another facility. This information is not intended to replace advice given to you by your health care provider. Make sure you discuss any questions you have with your health care provider. Document Revised: 09/10/2020 Document Reviewed: 05/08/2020 Elsevier Patient Education  2024 [employer].

## 2024-01-27 NOTE — Progress Notes (Signed)
 " [facility] [address] [place], [place] [address] Office:  [phone]  -- Annual Preventive Medical Office Visit --  Patient:  Tyler Cook      Age: 39 y.o.       Sex:  male  Date:   01/27/2024 Patient Care Team: Jesus Bernardino MATSU, MD as PCP - General (Internal Medicine) Today's Healthcare Provider: Bernardino MATSU Jesus, MD  ========================================= Chief complaint: Annual Exam (Pt is present for cpe pt is not fasting drunk protein shake )  Purpose of Visit: Comprehensive preventive health assessment and personalized health maintenance planning.  This encounter was conducted as a Comprehensive Physical Exam (CPE) preventive care annual visit. The patient's medical history and problem list were reviewed to inform individualized preventive care recommendations.   No problem-specific medical treatment was provided during this visit. Assessment & Plan Encounter for annual general medical examination with abnormal findings in adult Comprehensive Preventive Examination (Annual Wellness Visit) Performed Today. ?? Core Review: Interval HPI/ROS reviewed. History, current Medications (including reconciliation), known Allergies, and Immunization Status were thoroughly reviewed and updated. ?? Risk Assessment: Comprehensive Family, Social (including tobacco/alcohol/substance use), and Safety/Functional risks were assessed. Vitals and a complete, age-appropriate Physical Exam were fully documented. ? Preventive Planning: I reviewed all appropriate preventive screening tests (e.g., mammogram, colonoscopy, cervical cancer screening) and immunization needs (e.g., influenza, pneumococcal, Zoster). ??? Counseling & Education: Provided individualized counseling on Healthy Diet (e.g., DASH/Mediterranean), Regular Physical Activity (consistent with CDC guidelines), Sleep Hygiene, Stress Management strategies, and Mental Health screening results. ?? Conclusion:  Shared decision-making was performed for all recommended interventions. Orders were placed for indicated screenings and/or vaccinations. Written educational materials were provided to reinforce today's counseling and plan. FH: colon polyps BRBPR (bright red blood per rectum) Shared decision making recommended gastrointestinal but he defers for another. Bright red blood per rectum (BRBPR)   He experiences intermittent BRBPR, likely due to hemorrhoids, occurring once or twice a month. There is a family history of colon polyps, but no unintentional weight loss or other gastrointestinal symptoms. He has decided to defer a colonoscopy for another year. A GI consultation is recommended if symptoms persist or worsen. Sun-damaged skin Sun-damaged skin and neoplasm of uncertain behavior of skin   He has sun-damaged skin and multiple skin spots without current dermatological evaluation. He is referred to a dermatologist for assessment of these conditions. Nicotine dependence with nicotine-induced disorder, unspecified nicotine product type He currently uses vaping products and previously smoked cigarettes for 10 years. The potential risks of vaping, including exposure to heavy metals and lack of long-term data, were discussed. He enjoys vaping and is not interested in cessation at this time. He is encouraged to consider nicotine lozenges or Xen as alternatives. Central adiposity Central adiposity   His diet is high in carbohydrates, and he lacks an organized exercise routine, though he occasionally hikes. The importance of reducing carbohydrate intake was discussed, and he is encouraged to make dietary changes. Shoulder injury, left, sequela Left shoulder injury, sequela   He has a left shoulder injury with a large, non-tender lump and tightness, likely from a fall in November. There is no pain, but possible misalignment of the shoulder blade. Surgery is not indicated. He is referred to a sports medicine  specialist for evaluation and management. Neoplasm of uncertain behavior of skin Referral made dermatology , images collected for comparison. Immunization due After discussion of medical recommendations/risks/benefits, vaccine was given   Photographs Taken 01/27/2024 :  ICD-10-CM   1. Encounter for annual general medical examination with abnormal findings in adult  Z00.01 Lipid panel    Comprehensive metabolic panel with GFR    CBC with Differential/Platelet    2. FH: colon polyps  Z83.719     3. BRBPR (bright red blood per rectum)  K62.5     4. Sun-damaged skin  L57.8     5. Nicotine dependence with nicotine-induced disorder, unspecified nicotine product type  F17.209     6. Central adiposity  E65     7. Shoulder injury, left, sequela  S49.92XS Ambulatory referral to Sports Medicine    8. Neoplasm of uncertain behavior of skin  D48.5 Ambulatory referral to Dermatology    9. Immunization due  Z23 Flu vaccine trivalent PF, 6mos and older(Flulaval,Afluria,Fluarix,Fluzone)       Reviewed/updated/encouraged completion: Immunization History  Administered Date(s) Administered   DTP 05/12/1988, 07/22/1988, 03/30/1989   Hepb-cpg 01/27/2024   Influenza, Seasonal, Injecte, Preservative Fre 01/27/2024   Influenza-Unspecified 10/08/2022   MMR 03/30/1989   OPV 05/12/1988, 03/30/1989   PFIZER(Purple Top)SARS-COV-2 Vaccination 03/19/2019, 04/18/2019, 12/24/2019   Pfizer(Comirnaty)Fall Seasonal Vaccine 12 years and older 12/02/2021   Tdap 01/27/2024   There are no preventive care reminders to display for this patient. Recommended all above vaccines, ,when asked if interested he says not really but then agrees to [facility] Maintenance  Topic Date Due   COVID-19 Vaccine (5 - 2025-26 season) 02/12/2024 (Originally 09/12/2023)   Hepatitis B Vaccines 19-59 Average Risk (2 of 2 - CpG 2-dose series) 02/24/2024   DTaP/Tdap/Td (5 - Td or Tdap) 01/26/2034   Influenza Vaccine   Completed   HPV VACCINES (No Doses Required) Completed   Hepatitis C Screening  Completed   HIV Screening  Completed   Pneumococcal Vaccine  Aged Out   Meningococcal B Vaccine  Aged Out   Reviewed the following verbally with patient and provided AVS materials:  HEALTH MAINTENANCE COUNSELING AND ANTICIPATORY GUIDANCE    Preventive Measure Recommendation  Eye Exams Every 1-2 years  Dental Care Cleanings every 6 months or more, brush/floss 3x daily  Sinus Care Saline spray rinses daily  Sleep 8 hours nightly, good sleep hygiene, e-monitoring if any daytime drowsiness  Diet Fruits/vegetables/fiber/healthy fats, balance and moderation  Exercise 150 minutes weekly  Risk Behaviors Discouraged any/all high risk behaviors   CANCER SCREENING SHARED DECISION MAKING    Penile/Testicle/Scrotum Encouraged self-monitoring and reporting of genital abnormalities. Patient reports none.  Thyroid  Thyroid  was palpated for nodules today.  Prostate Individualized risks/benefits/costs discussed No results found for: PSA  Colon HM Colonoscopy   This patient has no relevant Health Maintenance data.   Discussed above  with bright red blood per rectum, defers  Lung Current guidelines recommend individuals aged 24 to 78 who currently smoke or formerly smoked and have a >= 20 pack-year smoking history should undergo annual screening with low-dose computed tomography (LDCT).  He is vaping avg not cigarettes did cgs 18-38 Tobacco Use: Low Risk (01/27/2024)   Patient History    Smoking Tobacco Use: Never    Smokeless Tobacco Use: Never    Passive Exposure: Not on file  Tobacco Use History[1]  Skin Advised regular sunscreen use. Patient denies worrisome, changing, or new skin lesions. Offered to include images in chart for surveillance. Showed patient these pictures of melanomas for reference to educate for self-monitoring.  Other Cancers Discussed lack of screening guidelines and insurance coverage for other  cancer types.    Discussed  the use of AI scribe software for clinical note transcription with the patient, who gave verbal consent to proceed.  History of Present Illness 39 year old male who presents for an annual physical exam.  In November, he experienced a fall landing on his back, potentially resulting in bruised or cracked ribs that have since healed. He now has a large lump below his collarbone, which causes slight tightness but no pain. He was lifting heavy objects during the holiday season, which may have exacerbated the issue.  He reports occasional bright red blood in the stool, occurring once or twice a month, which he attributes to hemorrhoids. There is a family history of colon polyps, but he has not consulted a GI specialist.  He smokes, primarily vaping, having switched from cigarettes about eight years ago, consuming about a pack a day in vape form. 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  His diet is high in carbohydrates, with breakfast typically consisting of a protein drink, granola bar, and banana. Lunch is usually a sandwich with chips, and dinner includes greens and fruit. He occasionally consumes sweets like donuts or cookies. He reports sleeping well, averaging six or more hours per night. He engages in occasional hikes but no organized exercise.  He lives with his parents and is close to his sister and her family. He does not participate in clubs or organizations but enjoys watching TV and movies, fishing, hiking, and tending to plants.  ROS A comprehensive ROS was negative for any concerning symptoms not already mentioned in HPI.  Completed medication reconciliation: Current Outpatient Medications on File Prior to Visit  Medication Sig   B Complex Vitamins (B COMPLEX PO) Take 1 tablet by mouth daily.   OMEPRAZOLE PO Take 1 tablet by mouth daily.   No current facility-administered medications on file prior to visit.  There are no discontinued  medications.The following were reviewed and/or entered/updated into our electronic MEDICAL RECORD NUMBERPast Medical History:  Diagnosis Date   Allergies    Anxiety    Substance abuse (HCC)   History reviewed. No pertinent surgical history. Social History   Socioeconomic History   Marital status: Single    Spouse name: Not on file   Number of children: Not on file   Years of education: Not on file   Highest education level: Not on file  Occupational History   Not on file  Tobacco Use   Smoking status: Never   Smokeless tobacco: Never  Vaping Use   Vaping status: Every Day  Substance and Sexual Activity   Alcohol use: Not Currently   Drug use: Not Currently    Types: Benzodiazepines, Heroin, Marijuana, Other-see comments    Comment: pain pills   Sexual activity: Not Currently  Other Topics Concern   Not on file  Social History Narrative   Not on file   Social Drivers of Health   Tobacco Use: Low Risk (01/27/2024)   Patient History    Smoking Tobacco Use: Never    Smokeless Tobacco Use: Never    Passive Exposure: Not on file  Financial Resource Strain: Not on file  Food Insecurity: Not on file  Transportation Needs: Not on file  Physical Activity: Not on file  Stress: Not on file  Social Connections: Socially Isolated (01/27/2024)   Social Connection and Isolation Panel    Frequency of Communication with Friends and Family: More than three times a week    Frequency of Social Gatherings with Friends and  Family: More than three times a week    Attends Religious Services: Never    Active Member of Clubs or Organizations: No    Attends Banker Meetings: Never    Marital Status: Never married  Intimate Partner Violence: Not on file  Depression (PHQ2-9): Low Risk (01/27/2024)   Depression (PHQ2-9)    PHQ-2 Score: 0  Alcohol Screen: Not on file  Housing: Not on file  Utilities: Not on file  Health Literacy: Not on file    Family History  Problem Relation  Age of Onset   Depression Sister    Hypertension Maternal Grandfather    Hyperlipidemia Maternal Grandfather    Heart disease Maternal Grandfather    Diabetes Maternal Grandfather    Heart attack Maternal Grandfather    Hearing loss Paternal Grandmother    Hyperlipidemia Paternal Grandfather    Heart disease Paternal Grandfather    Heart attack Paternal Grandfather    Colon cancer Other   Allergies[2] Social History   Substance and Sexual Activity  Sexual Activity Not Currently  @    01/27/2024    8:39 AM  Depression screen PHQ 2/9  Decreased Interest 0  Down, Depressed, Hopeless 0  PHQ - 2 Score 0      01/27/2024    8:39 AM  Fall Risk   Falls in the past year? 1  Number falls in past yr: 0  Injury with Fall? 1  Risk for fall due to : History of fall(s);No Fall Risks  Follow up Falls evaluation completed     BP 110/70   Pulse 70   Temp 98 F (36.7 C) (Temporal)   Ht 6' (1.829 m)   Wt 200 lb 3.2 oz (90.8 kg)   SpO2 98%   BMI 27.15 kg/m  BP Readings from Last 3 Encounters:  01/27/24 110/70  10/21/22 126/68   Wt Readings from Last 10 Encounters:  01/27/24 200 lb 3.2 oz (90.8 kg)  10/21/22 186 lb 6.4 oz (84.6 kg)  Physical Exam  GEN: No acute distress, resting comfortably. HEENT: Tympanic membranes normal appearing bilaterally, oropharynx clear, no thyromegaly noted, no palpable lymphadenopathy or thyroid  nodules. CARDIOVASCULAR: S1 and S2 heart sounds with regular rate and rhythm, no murmurs appreciated. PULMONARY: Normal work of breathing, clear to auscultation bilaterally, no crackles, wheezes, or rhonchi. ABDOMEN: Soft, nontender, nondistended. MSK: No edema, cyanosis, or clubbing noted.Right shoulder blade not palpable, area soft. Right clavicle out of alignment, large lump present. SKIN: Warm, dry, no lesions of concern observed.un damaged skin with multiple skin spots. NEUROLOGICAL: Cranial nerves II-XII grossly intact, strength 5/5 in upper and lower  extremities, reflexes symmetric and intact bilaterally. PSYCH: Normal affect and thought content, pleasant and cooperative.  Last CBC Lab Results  Component Value Date   WBC 6.5 01/27/2024   HGB 14.2 01/27/2024   HCT 41.9 01/27/2024   MCV 88.9 01/27/2024   RDW 13.4 01/27/2024   PLT 311.0 01/27/2024   Last metabolic panel Lab Results  Component Value Date   GLUCOSE 97 01/27/2024   NA 137 01/27/2024   K 4.8 01/27/2024   CL 100 01/27/2024   CO2 30 01/27/2024   BUN 16 01/27/2024   CREATININE 0.72 01/27/2024   GFR 117.69 01/27/2024   CALCIUM 9.7 01/27/2024   PROT 7.1 01/27/2024   ALBUMIN 4.7 01/27/2024   BILITOT 0.4 01/27/2024   ALKPHOS 48 01/27/2024   AST 20 01/27/2024   ALT 23 01/27/2024   Last lipids Lab Results  Component Value  Date   CHOL 151 01/27/2024   HDL 56.50 01/27/2024   LDLCALC 85 01/27/2024   TRIG 48.0 01/27/2024   CHOLHDL 3 01/27/2024   Last hemoglobin A1c Lab Results  Component Value Date   HGBA1C 5.4 10/21/2022   Last thyroid  functions Lab Results  Component Value Date   TSH 3.00 10/21/2022   Last vitamin D No results found for: 25OHVITD2, 25OHVITD3, VD25OH Last vitamin B12 and Folate No results found for: VITAMINB12, FOLATE      ======================================  IMPORTANT HEALTH REMINDERS: Report any new or changing skin lesions promptly Maintain recommended screening schedules Discuss any new family history of cancer at future visits Follow up on any new symptoms that persist more than two weeks      Notes:  This document was synthesized by artificial intelligence (Abridge) using HIPAA-compliant recording of the clinical interaction;   We discussed the use of AI scribe software for clinical note transcription with the patient, who gave verbal consent to proceed.    This encounter employed state-of-the-art, real-time, collaborative documentation. The patient was empowered to actively review and assist in updating  their electronic medical record on a shared monitor, ensuring transparency and improving accuracy.    Prior to and at the beginning of Comprehensive Physical Exam (CPE) preventive care annual visit appointment types  we clarify to patients Our goal today is to focus on your preventive or annual Comprehensive Physical Exam (CPE) preventive care annual visit, which typically covers routine screenings and overall health maintenance. However, if you share any new or concerning symptoms--such as dizziness, passing out, severe pain, or anything else that may point to a more serious issue--we are both legally and ethically required to evaluate it. We cannot simply overlook or ignore such concerns, even if you later decide you dont want to discuss them, because it could jeopardize your health.  If addressing a new concern takes us  beyond the scope of the preventive visit, we may need to bill separately for that portion of care. We understand financial considerations are important, and were happy to discuss your options if something new comes up. However, we want to be clear that once you mention a potentially serious issue, we must investigate it; we cant ethically or legally exclude that from our records or our evaluation. Please let us  know all of your questions or worries. Together, we can decide how best to manage them and how to minimize any unexpected costs, but we want to keep you safe above all else.   This disclosure is mandated by professional ethics and legal obligations, as healthcare providers must address any substantial health concerns raised during any patient interaction and a comprehensive ROS is required by insurance companies for billing preventive-care visit type.   This disclosure ultimately discourages patients financially from reporting significant health issues.   Medical Screening Exam A medical screening exam (MSE) helps to determine whether you need immediate medical treatment relating  to any number of symptoms you are having. This type of exam may be done in an emergency department, an urgent care setting, or your health care provider's office. Depending on your symptoms and severity, you may need additional tests or medical therapy. It is important to note that an MSE does not necessarily mean that you will need or receive further medical testing or interventions if your symptoms are not deemed to be medically urgent (emergent). Tell a health care provider about: Any allergies you have. All medicines you are taking, including vitamins, herbs, eye drops,  creams, and over-the-counter medicines. Any problems you or family members have had with anesthetic medicines. Any bleeding problems you have. Any surgeries you have had. Any medical conditions you have. Whether you are pregnant or may be pregnant. What happens during the test? During the exam, a health care provider does a short, often focused, physical exam and asks about your medical history to assess: Your current symptoms. Your overall health. Your need for possible further medical intervention. What can I expect after the test? If you have a regular health care provider, make an appointment for a follow-up visit with him or her. If you do not have a regular health care provider, ask about resources in your community. Your medical screening exam may determine that: You do not need emergency treatment at this time. You need treatment right away. You need to be transferred to another medical center. This may happen if you need an emergent specialist or consultant that is not available at the medical center you are at. You need to have more tests. A medical specialist may be consulted if needed. Get help right away if: Your condition gets worse. You develop new or troubling symptoms before you see your health care provider. These symptoms may represent a serious problem that is an emergency. Do not wait to see if the  symptoms will go away. Get medical help right away. Call your local emergency services (911 in the [place] Do not drive yourself to the hospital. Summary A medical screening exam helps to determine whether you need medical treatment right away. This type of exam may be done in an emergency department, an urgent care setting, or your health care provider's office. During the exam, a health care provider does a short physical exam and asks about your current symptoms and overall health. Depending on the exam, more tests or therapies may be ordered. However, an MSE does not necessarily mean that you will have further medical testing if your symptoms are not deemed to be urgent. If you need further care that is not offered at your current medical center, you may need to be transferred to another facility. This information is not intended to replace advice given to you by your health care provider. Make sure you discuss any questions you have with your health care provider. Document Revised: 09/10/2020 Document Reviewed: 05/08/2020 Elsevier Patient Education  2024 [employer] Inc[employer]   Health Maintenance, Male Adopting a healthy lifestyle and getting preventive care are important in promoting health and wellness. Ask your health care provider about: The right schedule for you to have regular tests and exams. Things you can do on your own to prevent diseases and keep yourself healthy. What should I know about diet, weight, and exercise? Eat a healthy diet  Eat a diet that includes plenty of vegetables, fruits, low-fat dairy products, and lean protein. Do not eat a lot of foods that are high in solid fats, added sugars, or sodium. Maintain a healthy weight Body mass index (BMI) is a measurement that can be used to identify possible weight problems. It estimates body fat based on height and weight. Your health care provider can help determine your BMI and help you achieve or maintain a healthy weight. Get  regular exercise Get regular exercise. This is one of the most important things you can do for your health. Most adults should: Exercise for at least 150 minutes each week. The exercise should increase your heart rate and make you sweat (moderate-intensity exercise). Do strengthening exercises  at least twice a week. This is in addition to the moderate-intensity exercise. Spend less time sitting. Even light physical activity can be beneficial. Watch cholesterol and blood lipids Have your blood tested for lipids and cholesterol at 37 years of age, then have this test every 5 years. You may need to have your cholesterol levels checked more often if: Your lipid or cholesterol levels are high. You are older than 37 years of age. You are at high risk for heart disease. What should I know about cancer screening? Many types of cancers can be detected early and may often be prevented. Depending on your health history and family history, you may need to have cancer screening at various ages. This may include screening for: Colorectal cancer. Prostate cancer. Skin cancer. Lung cancer. What should I know about heart disease, diabetes, and high blood pressure? Blood pressure and heart disease High blood pressure causes heart disease and increases the risk of stroke. This is more likely to develop in people who have high blood pressure readings or are overweight. Talk with your health care provider about your target blood pressure readings. Have your blood pressure checked: Every 3-5 years if you are 64-4 years of age. Every year if you are 51 years old or older. If you are between the ages of 36 and 33 and are a current or former smoker, ask your health care provider if you should have a one-time screening for abdominal aortic aneurysm (AAA). Diabetes Have regular diabetes screenings. This checks your fasting blood sugar level. Have the screening done: Once every three years after age 64 if you are at  a normal weight and have a low risk for diabetes. More often and at a younger age if you are overweight or have a high risk for diabetes. What should I know about preventing infection? Hepatitis B If you have a higher risk for hepatitis B, you should be screened for this virus. Talk with your health care provider to find out if you are at risk for hepatitis B infection. Hepatitis C Blood testing is recommended for: Everyone born from [birth year] through 1963. Anyone with known risk factors for hepatitis C. Sexually transmitted infections (STIs) You should be screened each year for STIs, including gonorrhea and chlamydia, if: You are sexually active and are younger than 37 years of age. You are older than 37 years of age and your health care provider tells you that you are at risk for this type of infection. Your sexual activity has changed since you were last screened, and you are at increased risk for chlamydia or gonorrhea. Ask your health care provider if you are at risk. Ask your health care provider about whether you are at high risk for HIV. Your health care provider may recommend a prescription medicine to help prevent HIV infection. If you choose to take medicine to prevent HIV, you should first get tested for HIV. You should then be tested every 3 months for as long as you are taking the medicine. Follow these instructions at home: Alcohol use Do not drink alcohol if your health care provider tells you not to drink. If you drink alcohol: Limit how much you have to 0-2 drinks a day. Know how much alcohol is in your drink. In the [place] one drink equals one 12 oz bottle of beer (355 mL), one 5 oz glass of wine (148 mL), or one 1 oz glass of hard liquor (44 mL). Lifestyle Do not use any products that contain  nicotine or tobacco. These products include cigarettes, chewing tobacco, and vaping devices, such as e-cigarettes. If you need help quitting, ask your health care provider. Do not use  street drugs. Do not share needles. Ask your health care provider for help if you need support or information about quitting drugs. General instructions Schedule regular health, dental, and eye exams. Stay current with your vaccines. Tell your health care provider if: You often feel depressed. You have ever been abused or do not feel safe at home. Summary Adopting a healthy lifestyle and getting preventive care are important in promoting health and wellness. Follow your health care provider's instructions about healthy diet, exercising, and getting tested or screened for diseases. Follow your health care provider's instructions on monitoring your cholesterol and blood pressure. This information is not intended to replace advice given to you by your health care provider. Make sure you discuss any questions you have with your health care provider. Document Revised: 05/19/2020 Document Reviewed: 05/19/2020 [employer]vier Patient Education  2024 [employer]     [1]  Social History Tobacco Use  Smoking Status Never  Smokeless Tobacco Never  [2] No Known Allergies  "

## 2024-01-29 ENCOUNTER — Ambulatory Visit: Payer: Self-pay | Admitting: Internal Medicine

## 2024-02-03 NOTE — Progress Notes (Unsigned)
"             ° °   Tyler Cook D.[facility] [address] [place] [address] Phone: [phone]   Assessment and Plan:     ***    Pertinent previous records reviewed include ***   Follow Up: ***     Subjective:   I, Tyler Cook, am serving as a neurosurgeon for Tyler Cook  Chief Complaint: left shoulder pain   HPI:   02/07/2024 Patient is a 39 year old male with left shoulder pain. Patient states  Relevant Historical Information: ***  Additional pertinent review of systems negative.  Current Medications[1]   Objective:     There were no vitals filed for this visit.    There is no height or weight on file to calculate BMI.    Physical Exam:    ***   Electronically signed by:  Tyler Cook D.[facility] 7:23 AM 02/03/24    [1]  Current Outpatient Medications:    B Complex Vitamins (B COMPLEX PO), Take 1 tablet by mouth daily., Disp: , Rfl:    OMEPRAZOLE PO, Take 1 tablet by mouth daily., Disp: , Rfl:   "

## 2024-02-07 ENCOUNTER — Ambulatory Visit: Admitting: Sports Medicine

## 2024-02-13 ENCOUNTER — Ambulatory Visit: Admitting: Sports Medicine

## 2025-01-29 ENCOUNTER — Encounter: Admitting: Internal Medicine
